# Patient Record
Sex: Female | Born: 1985 | Race: Black or African American | Hispanic: No | Marital: Single | Smoking: Never smoker
Health system: Southern US, Community
[De-identification: ages and names within clinical notes are randomized; demographics above are authoritative.]

## PROBLEM LIST (undated history)

## (undated) DIAGNOSIS — I10 Essential (primary) hypertension: Secondary | ICD-10-CM

---

## 2016-10-14 ENCOUNTER — Emergency Department (HOSPITAL_COMMUNITY)
Admission: EM | Admit: 2016-10-14 | Discharge: 2016-10-14 | Disposition: A | Discharge status: Home / Self Care | Payer: Self-pay | Attending: Emergency Medicine | Admitting: Emergency Medicine

## 2016-10-14 ENCOUNTER — Encounter (HOSPITAL_COMMUNITY): Payer: Self-pay | Admitting: *Deleted

## 2016-10-14 DIAGNOSIS — I10 Essential (primary) hypertension: Secondary | ICD-10-CM | POA: Insufficient documentation

## 2016-10-14 DIAGNOSIS — G43009 Migraine without aura, not intractable, without status migrainosus: Secondary | ICD-10-CM | POA: Insufficient documentation

## 2016-10-14 DIAGNOSIS — Z79899 Other long term (current) drug therapy: Secondary | ICD-10-CM | POA: Insufficient documentation

## 2016-10-14 HISTORY — DX: Essential (primary) hypertension: I10

## 2016-10-14 MED ORDER — SODIUM CHLORIDE 0.9 % IV BOLUS (SEPSIS)
1000.0000 mL | Freq: Once | INTRAVENOUS | Status: AC
  Administered 2016-10-14: 1000 mL via INTRAVENOUS

## 2016-10-14 MED ORDER — METOCLOPRAMIDE HCL 5 MG/ML IJ SOLN
10.0000 mg | Freq: Once | INTRAMUSCULAR | Status: AC
  Administered 2016-10-14: 10 mg via INTRAVENOUS
  Filled 2016-10-14: qty 2

## 2016-10-14 MED ORDER — NAPROXEN 500 MG PO TABS: 500.0000 mg | ORAL_TABLET | Freq: Two times a day (BID) | ORAL | 0 refills | Status: DC

## 2016-10-14 MED ORDER — PROCHLORPERAZINE EDISYLATE 5 MG/ML IJ SOLN
10.0000 mg | Freq: Once | INTRAMUSCULAR | Status: AC
  Administered 2016-10-14: 10 mg via INTRAVENOUS
  Filled 2016-10-14: qty 2

## 2016-10-14 MED ORDER — BUTALBITAL-APAP-CAFFEINE 50-325-40 MG PO TABS: 1.0000 | ORAL_TABLET | Freq: Four times a day (QID) | ORAL | 0 refills | Status: DC | PRN

## 2016-10-14 MED ORDER — KETOROLAC TROMETHAMINE 30 MG/ML IJ SOLN
30.0000 mg | Freq: Once | INTRAMUSCULAR | Status: AC
  Administered 2016-10-14: 30 mg via INTRAVENOUS
  Filled 2016-10-14: qty 1

## 2016-10-14 NOTE — Discharge Instructions (Signed)
Return to the ED with any concerns including changes in vision or speech, weakness of arms or legs, fainting, seizure activity, vomiting and not able to keep down liquids, decreased levlel of alertness/lethargy, or any other alarming symptoms

## 2016-10-14 NOTE — ED Notes (Signed)
Patient was alert, oriented and stable upon discharge. RN went over AVS and patient had no further questions.  

## 2016-10-14 NOTE — ED Triage Notes (Signed)
Pt complains of constant headache for the past month since moving to [place] from [place]. Pt states the pain is now 7/10, but is sometimes 10/10.

## 2016-10-14 NOTE — ED Notes (Signed)
ED Provider at bedside. 

## 2016-10-14 NOTE — ED Provider Notes (Signed)
[facility]-EMER[facility]ENCY DEPT Provider Note   CSN: [id number] Arrival date & time: 10/14/16  1614     History   Chief Complaint Chief Complaint  Patient presents with  . Headache    HPI Joyce Collins is a 30 y.o. female.  HPI  Pt with hx of migraines presents with ongoing headache daily for the past several days. She states she moved from [place] and has been off the topamax that she had been prescribed for her migraines.  HA is throbbing, constant, gradual in onset and feels like her prior migraines.  Associated with nausea. No vomiting.  No fever, no neck pain.  She has tried excedrin today without much relief.  There are no other associated systemic symptoms, there are no other alleviating or modifying factors.   Past Medical History:  Diagnosis Date  . Hypertension     There are no active problems to display for this patient.   History reviewed. No pertinent surgical history.  OB History    No data available       Home Medications    Prior to Admission medications   Medication Sig Start Date End Date Taking? Authorizing Provider  butalbital-acetaminophen-caffeine (FIORICET, ESGIC) [phone] MG tablet Take 1-2 tablets by mouth every 6 (six) hours as needed for headache. 10/14/16 10/14/17  Jerelyn ScottMartha Linker, MD  naproxen (NAPROSYN) 500 MG tablet Take 1 tablet (500 mg total) by mouth 2 (two) times daily. 10/14/16   Jerelyn ScottMartha Linker, MD    Family History No family history on file.  Social History Social History  Substance Use Topics  . Smoking status: Never Smoker  . Smokeless tobacco: Never Used  . Alcohol use No     Allergies   Patient has no allergy information on record.   Review of Systems Review of Systems  ROS reviewed and all otherwise negative except for mentioned in HPI   Physical Exam Updated Vital Signs BP (!) 147/108 (BP Location: Left Arm)   Pulse 98   Temp 97.8 F (36.6 C) (Oral)   Resp 20   SpO2 100%  Vitals reviewed Physical  Exam Physical Examination: General appearance - alert, well appearing, and in no distress Mental status - alert, oriented to person, place, and time Eyes - pupils equal and reactive, extraocular eye movements intact Mouth - mucous membranes moist, pharynx normal without lesions Neck - supple, no significant adenopathy Chest - clear to auscultation, no wheezes, rales or rhonchi, symmetric air entry Heart - normal rate, regular rhythm, normal S1, S2, no murmurs, rubs, clicks or gallops Abdomen - soft, nontender, nondistended, no masses or organomegaly Neurological - alert, oriented x 3, normal speech, cranial nerves 2-12 tested and intact, strength 5/5 in extremities x 4, sensation intact Extremities - peripheral pulses normal, no pedal edema, no clubbing or cyanosis Skin - normal coloration and turgor, no rashes  ED Treatments / Results  Labs (all labs ordered are listed, but only abnormal results are displayed) Labs Reviewed - No data to display  EKG  EKG Interpretation None       Radiology No results found.  Procedures Procedures (including critical care time)  Medications Ordered in ED Medications  ketorolac (TORADOL) 30 MG/ML injection 30 mg (30 mg Intravenous Given 10/14/16 1725)  prochlorperazine (COMPAZINE) injection 10 mg (10 mg Intravenous Given 10/14/16 1726)  metoCLOPramide (REGLAN) injection 10 mg (10 mg Intravenous Given 10/14/16 1725)  sodium chloride 0.9 % bolus 1,000 mL (0 mLs Intravenous Stopped 10/14/16 1851)     Initial Impression /  Assessment and Plan / ED Course  I have reviewed the triage vital signs and the nursing notes.  Pertinent labs & imaging results that were available during my care of the patient were reviewed by me and considered in my medical decision making (see chart for details).  Clinical Course   6:58 PM on recheck patient feels much improved.    Pt presenting with headache c/w her prior migraines.  HA gradual in onset, normal neuro  exam. Doubt SAH, venous sinus thrombosis, meningitis or other causes headache.  Pt feels much improved after migraine cocktail.  Given information for PMD and neurology folowup .  Discharged with strict return precautions.  Pt agreeable with plan.  Final Clinical Impressions(s) / ED Diagnoses   Final diagnoses:  Migraine without aura and without status migrainosus, not intractable    New Prescriptions Discharge Medication List as of 10/14/2016  7:01 PM    START taking these medications   Details  butalbital-acetaminophen-caffeine (FIORICET, ESGIC) 50-325-40 MG tablet Take 1-2 tablets by mouth every 6 (six) hours as needed for headache., Starting Sun 10/14/2016, Until Mon 10/14/2017, Print    naproxen (NAPROSYN) 500 MG tablet Take 1 tablet (500 mg total) by mouth 2 (two) times daily., Starting Sun 10/14/2016, Print         Jerelyn ScottMartha Linker, MD 10/14/16 2030

## 2017-02-10 ENCOUNTER — Emergency Department (HOSPITAL_COMMUNITY): Payer: Self-pay

## 2017-02-10 ENCOUNTER — Emergency Department (HOSPITAL_COMMUNITY)
Admission: EM | Admit: 2017-02-10 | Discharge: 2017-02-11 | Disposition: A | Discharge status: Home / Self Care | Payer: Self-pay | Attending: Emergency Medicine | Admitting: Emergency Medicine

## 2017-02-10 ENCOUNTER — Encounter (HOSPITAL_COMMUNITY): Payer: Self-pay | Admitting: Emergency Medicine

## 2017-02-10 DIAGNOSIS — R000 Tachycardia, unspecified: Secondary | ICD-10-CM

## 2017-02-10 DIAGNOSIS — I10 Essential (primary) hypertension: Secondary | ICD-10-CM | POA: Insufficient documentation

## 2017-02-10 DIAGNOSIS — R0789 Other chest pain: Secondary | ICD-10-CM

## 2017-02-10 LAB — CBC
HCT: 37.7 % (ref 36.0–46.0)
Hemoglobin: 12.9 g/dL (ref 12.0–15.0)
MCH: 28.4 pg (ref 26.0–34.0)
MCHC: 34.2 g/dL (ref 30.0–36.0)
MCV: 83 fL (ref 78.0–100.0)
PLATELETS: 343 10*3/uL (ref 150–400)
RBC: 4.54 MIL/uL (ref 3.87–5.11)
RDW: 12.9 % (ref 11.5–15.5)
WBC: 10.2 10*3/uL (ref 4.0–10.5)

## 2017-02-10 LAB — URINALYSIS, ROUTINE W REFLEX MICROSCOPIC
Bacteria, UA: NONE SEEN
Bilirubin Urine: NEGATIVE
Glucose, UA: NEGATIVE mg/dL
Hgb urine dipstick: NEGATIVE
KETONES UR: NEGATIVE mg/dL
Leukocytes, UA: NEGATIVE
Nitrite: NEGATIVE
PROTEIN: 30 mg/dL — AB
Specific Gravity, Urine: 1.026 (ref 1.005–1.030)
pH: 6 (ref 5.0–8.0)

## 2017-02-10 LAB — I-STAT TROPONIN, ED
TROPONIN I, POC: 0 ng/mL (ref 0.00–0.08)
Troponin i, poc: 0 ng/mL (ref 0.00–0.08)

## 2017-02-10 LAB — BASIC METABOLIC PANEL
Anion gap: 6 (ref 5–15)
BUN: 14 mg/dL (ref 6–20)
CALCIUM: 9.7 mg/dL (ref 8.9–10.3)
CHLORIDE: 103 mmol/L (ref 101–111)
CO2: 27 mmol/L (ref 22–32)
CREATININE: 0.78 mg/dL (ref 0.44–1.00)
GFR calc Af Amer: >60 mL/min (ref >60)
GFR calc non Af Amer: >60 mL/min (ref >60)
Glucose, Bld: 97 mg/dL (ref 65–99)
Potassium: 3.4 mmol/L — ABNORMAL LOW (ref 3.5–5.1)
SODIUM: 136 mmol/L (ref 135–145)

## 2017-02-10 LAB — POC URINE PREG, ED: PREG TEST UR: NEGATIVE

## 2017-02-10 MED ORDER — HYDROCODONE-ACETAMINOPHEN 5-325 MG PO TABS
2.0000 | ORAL_TABLET | Freq: Once | ORAL | Status: AC
Start: 2017-02-10 — End: 2017-02-10
  Administered 2017-02-10: 2 via ORAL
  Filled 2017-02-10 (×2): qty 2

## 2017-02-10 MED ORDER — IOPAMIDOL (ISOVUE-370) INJECTION 76%
INTRAVENOUS | Status: AC
  Administered 2017-02-10: 80 mL via INTRAVENOUS
  Filled 2017-02-10: qty 100

## 2017-02-10 NOTE — ED Provider Notes (Signed)
[facility]PT Provider Note   CSN: [id number] Arrival date & time: 02/10/17  1904   By signing my name below, I, Clarisse Gouge, attest that this documentation has been prepared under the direction and in the presence of [employer], PA-C. Electronically Signed: Clarisse Gouge, Scribe. 02/10/17. 10:37 PM.   History   Chief Complaint Chief Complaint  Patient presents with  . Chest Pain   The history is provided by the patient and medical records. No language interpreter was used.    HPI Comments: Joyce Collins is a 31 y.o. female with Hx of HTN who presents to the Emergency Department complaining of intermittent chest pain x 2 AM today waking her from sleep. She notes Hx of similar > 1 year ago. Pt describes sharp, stabbing, episodic 8/10 pain that is improved with rest, completely subsiding without intervention and worsened with deep breathing. She notes associated palpitations, high heart rates and anxiety. She denies Nausea, vomiting, diaphoresis, shortness of breath, weakness, numbness, syncope, near syncope. She states she recently moved here and she was followed by a cardiologist in [place]. Pt on lisinopril for HTN and depo provera. She adds she used to take medications for anxiety before recently moving to the area. She denies long travel, immobilization, leg swelling, Hx of blood clots, FMHx of clots or sickle cell, fever, chills, cough, congestion or recent surgery.  Past Medical History:  Diagnosis Date  . Hypertension     There are no active problems to display for this patient.   History reviewed. No pertinent surgical history.  OB History    No data available       Home Medications    Prior to Admission medications   Medication Sig Start Date End Date Taking? Authorizing Provider  aspirin-acetaminophen-caffeine (EXCEDRIN MIGRAINE) [phone] MG tablet Take 2 tablets by mouth every 6 (six) hours as needed for headache.   Yes Historical Provider, MD    lisinopril (PRINIVIL,ZESTRIL) 10 MG tablet Take 10 mg by mouth daily.   Yes Historical Provider, MD    Family History History reviewed. No pertinent family history.  Social History Social History  Substance Use Topics  . Smoking status: Never Smoker  . Smokeless tobacco: Never Used  . Alcohol use No     Allergies   Patient has no known allergies.   Review of Systems Review of Systems  Constitutional: Negative for chills and fever.  HENT: Negative for congestion.   Respiratory: Negative for cough and shortness of breath.   Cardiovascular: Positive for chest pain and palpitations. Negative for leg swelling.       -long distance travel, -recent immobilization  All other systems reviewed and are negative.    Physical Exam Updated Vital Signs BP 119/86 (BP Location: Left Arm)   Pulse 94   Temp 97.8 F (36.6 C) (Oral)   Resp 18   Ht  (1.6 m)   Wt 196 lb (88.9 kg)   SpO2 100%   BMI 34.72 kg/m   Physical Exam  Constitutional: She appears well-developed and well-nourished. No distress.  Awake, alert, nontoxic appearance  HENT:  Head: Normocephalic and atraumatic.  Mouth/Throat: Oropharynx is clear and moist. No oropharyngeal exudate.  Eyes: Conjunctivae are normal. No scleral icterus.  Neck: Normal range of motion. Neck supple.  Cardiovascular: Regular rhythm and intact distal pulses.  Tachycardia present.   Pulses:      Radial pulses are 2+ on the right side, and 2+ on the left side.  Dorsalis pedis pulses are 2+ on the right side, and 2+ on the left side.  Pulmonary/Chest: Effort normal and breath sounds normal. No respiratory distress. She has no wheezes.  Equal chest expansion  Abdominal: Soft. Bowel sounds are normal. She exhibits no mass. There is no tenderness. There is no rebound and no guarding.  Musculoskeletal: Normal range of motion. She exhibits no edema.  No calf Tenderness Negative Homans sign  Neurological: She is alert.  Speech is  clear and goal oriented Moves extremities without ataxia  Skin: Skin is warm and dry. She is not diaphoretic.  Psychiatric: She has a normal mood and affect.  Nursing note and vitals reviewed.    ED Treatments / Results  DIAGNOSTIC STUDIES: Oxygen Saturation is 100% on RA, normal by my interpretation.    COORDINATION OF CARE: 10:22 PM Discussed treatment plan with pt at bedside and pt agreed to plan. Will order C/T, pain medications, blood work and IV.  Labs (all labs ordered are listed, but only abnormal results are displayed) Labs Reviewed  BASIC METABOLIC PANEL - Abnormal; Notable for the following:       Result Value   Potassium 3.4 (*)    All other components within normal limits  URINALYSIS, ROUTINE W REFLEX MICROSCOPIC - Abnormal; Notable for the following:    Protein, ur 30 (*)    Squamous Epithelial / LPF 0-5 (*)    All other components within normal limits  CBC  I-STAT TROPOININ, ED  POC URINE PREG, ED  I-STAT TROPOININ, ED    EKG  EKG Interpretation  Date/Time:  Sunday February 10 2017 19:09:11 EDT Ventricular Rate:  124 PR Interval:    QRS Duration: 78 QT Interval:  339 QTC Calculation: 487 R Axis:   81 Text Interpretation:  Sinus tachycardia Borderline repolarization abnormality Borderline prolonged QT interval Baseline wander in lead(s) II III aVF agree. no old comparison Confirmed by Donnald Garre, MD, Lebron Conners [phone]) on 02/11/2017 1:43:03 AM        Radiology Dg Chest 2 View  Result Date: 02/10/2017 CLINICAL DATA:  Chest pain EXAM: CHEST  2 VIEW COMPARISON:  None. FINDINGS: Normal heart size. Normal mediastinal contour. No pneumothorax. No pleural effusion. Lungs appear clear, with no acute consolidative airspace disease and no pulmonary edema. IMPRESSION: No active cardiopulmonary disease. Electronically Signed   By: Delbert Phenix M.D.   On: 02/10/2017 19:54   Ct Angio Chest Pe W Or Wo Contrast  Result Date: 02/10/2017 CLINICAL DATA:  Acute onset of intermittent  generalized chest pain and palpitations. Initial encounter. EXAM: CT ANGIOGRAPHY CHEST WITH CONTRAST TECHNIQUE: Multidetector CT imaging of the chest was performed using the standard protocol during bolus administration of intravenous contrast. Multiplanar CT image reconstructions and MIPs were obtained to evaluate the vascular anatomy. CONTRAST:  80 mL of Isovue 370 IV contrast COMPARISON:  Chest radiograph performed earlier today at 7:43 p.m. FINDINGS: Cardiovascular:  There is no evidence of pulmonary embolus. The heart is normal in size. The thoracic aorta is unremarkable in appearance, aside from minimal calcification at the distal aortic arch. The great vessels are within normal limits. Mediastinum/Nodes: Residual thymic tissue is within normal limits. No mediastinal lymphadenopathy is seen. No pericardial effusion is identified. The visualized portions of the thyroid gland are unremarkable. No axillary lymphadenopathy is appreciated. Lungs/Pleura: Minimal right basilar scarring is noted. No pleural effusion or pneumothorax is seen. No masses are identified. Upper Abdomen: The visualized portions of the liver and spleen are grossly unremarkable. Musculoskeletal: No  acute osseous abnormalities are identified. The visualized musculature is unremarkable in appearance. Review of the MIP images confirms the above findings. IMPRESSION: 1. No evidence of pulmonary embolus. 2. Minimal right basilar scarring noted.  Lungs otherwise clear. Electronically Signed   By: Roanna Raider M.D.   On: 02/10/2017 23:54    Procedures Procedures (including critical care time)  Medications Ordered in ED Medications  HYDROcodone-acetaminophen (NORCO/VICODIN) 5-325 MG per tablet 2 tablet (2 tablets Oral Given 02/10/17 2341)  iopamidol (ISOVUE-370) 76 % injection (80 mLs Intravenous Contrast Given 02/10/17 2301)     Initial Impression / Assessment and Plan / ED Course  I have reviewed the triage vital signs and the nursing  notes.  Pertinent labs & imaging results that were available during my care of the patient were reviewed by me and considered in my medical decision making (see chart for details).  Clinical Course as of Feb 12 124  Mon Feb 11, 2017  0123 Pt reports she is chest pain free.  She reports she is still nervous about being in the ED.  Her tachycardia is improved but persists.  She reports this is normal for her.  She is without SOB.   [HM]    Clinical Course User Index [HM] Dierdre Forth, PA-C    Patient is to be discharged with recommendation to follow up with PCP in regards to today's hospital visit. Chest pain is not likely of cardiac or pulmonary etiology d/t presentation, CT angio chest negative for PE, no tracheal deviation, no JVD or new murmur, RRR, breath sounds equal bilaterally, EKG without evidence of ischemia, negative troponin, and negative CXR. Pt has tachycardia on arrival and though improved, persists.  Pt reports this is normal for her.  Mild hypokalemia replaced in the department.  Pt has been advised to return to the ED if CP becomes exertional, associated with diaphoresis or nausea, radiates to left jaw/arm, worsens or becomes concerning in any way. Pt appears reliable for follow up and is agreeable to discharge.   BP 107/66   Pulse 100   Temp 97.8 F (36.6 C) (Oral)   Resp (!) 22   Ht  (1.6 m)   Wt 88.9 kg   SpO2 98%   BMI 34.72 kg/m   Case has been discussed with and ECG reviewed by Dr. Donnald Garre who agrees with the above plan to discharge.   Final Clinical Impressions(s) / ED Diagnoses   Final diagnoses:  Other chest pain  Tachycardia    New Prescriptions Current Discharge Medication List     I personally performed the services described in this documentation, which was scribed in my presence. The recorded information has been reviewed and is accurate.    Dahlia Client Shandi Godfrey, PA-C 02/11/17 0147    Arby Barrette, MD 02/12/17 1640

## 2017-02-10 NOTE — ED Triage Notes (Signed)
Pt c/o intermittent sharp central chest pain usually with movement or inspiration but sometimes at rest too, onset this morning at about 0200. No SOB, dizziness, arm or jaw pain/numbness.

## 2017-02-11 NOTE — Discharge Instructions (Signed)
1. Medications: usual home medications 2. Treatment: rest, drink plenty of fluids,  3. Follow Up: Please followup with Cardiology in 2-3 days for discussion of your diagnoses and further evaluation after today's visit; if you do not have a primary care doctor use the resource guide provided to find one; Please return to the ER for chest pain that radiates, worsening palpitations, passing out or other concerns

## 2017-04-17 ENCOUNTER — Emergency Department (HOSPITAL_COMMUNITY): Payer: Self-pay

## 2017-04-17 ENCOUNTER — Emergency Department (HOSPITAL_COMMUNITY)
Admission: EM | Admit: 2017-04-17 | Discharge: 2017-04-17 | Disposition: A | Discharge status: Home / Self Care | Payer: Self-pay | Attending: Emergency Medicine | Admitting: Emergency Medicine

## 2017-04-17 ENCOUNTER — Encounter (HOSPITAL_COMMUNITY): Payer: Self-pay | Admitting: Emergency Medicine

## 2017-04-17 DIAGNOSIS — Z7982 Long term (current) use of aspirin: Secondary | ICD-10-CM | POA: Insufficient documentation

## 2017-04-17 DIAGNOSIS — I10 Essential (primary) hypertension: Secondary | ICD-10-CM | POA: Insufficient documentation

## 2017-04-17 DIAGNOSIS — Z79899 Other long term (current) drug therapy: Secondary | ICD-10-CM | POA: Insufficient documentation

## 2017-04-17 DIAGNOSIS — R0789 Other chest pain: Secondary | ICD-10-CM | POA: Insufficient documentation

## 2017-04-17 DIAGNOSIS — Z793 Long term (current) use of hormonal contraceptives: Secondary | ICD-10-CM | POA: Insufficient documentation

## 2017-04-17 LAB — CBC WITH DIFFERENTIAL/PLATELET
BASOS ABS: 0 10*3/uL (ref 0.0–0.1)
Basophils Relative: 0 %
Eosinophils Absolute: 0.1 10*3/uL (ref 0.0–0.7)
Eosinophils Relative: 1 %
HEMATOCRIT: 35.6 % — AB (ref 36.0–46.0)
Hemoglobin: 12.1 g/dL (ref 12.0–15.0)
LYMPHS ABS: 1.9 10*3/uL (ref 0.7–4.0)
LYMPHS PCT: 26 %
MCH: 28.7 pg (ref 26.0–34.0)
MCHC: 34 g/dL (ref 30.0–36.0)
MCV: 84.6 fL (ref 78.0–100.0)
MONO ABS: 0.5 10*3/uL (ref 0.1–1.0)
Monocytes Relative: 7 %
NEUTROS ABS: 4.9 10*3/uL (ref 1.7–7.7)
Neutrophils Relative %: 66 %
Platelets: 280 10*3/uL (ref 150–400)
RBC: 4.21 MIL/uL (ref 3.87–5.11)
RDW: 12.6 % (ref 11.5–15.5)
WBC: 7.4 10*3/uL (ref 4.0–10.5)

## 2017-04-17 LAB — I-STAT TROPONIN, ED: TROPONIN I, POC: 0.01 ng/mL (ref 0.00–0.08)

## 2017-04-17 LAB — COMPREHENSIVE METABOLIC PANEL
ALT: 24 U/L (ref 14–54)
AST: 27 U/L (ref 15–41)
Albumin: 4 g/dL (ref 3.5–5.0)
Alkaline Phosphatase: 77 U/L (ref 38–126)
Anion gap: 8 (ref 5–15)
BILIRUBIN TOTAL: 0.7 mg/dL (ref 0.3–1.2)
BUN: 11 mg/dL (ref 6–20)
CO2: 21 mmol/L — ABNORMAL LOW (ref 22–32)
CREATININE: 0.59 mg/dL (ref 0.44–1.00)
Calcium: 9.5 mg/dL (ref 8.9–10.3)
Chloride: 108 mmol/L (ref 101–111)
GFR calc Af Amer: >60 mL/min (ref >60)
Glucose, Bld: 123 mg/dL — ABNORMAL HIGH (ref 65–99)
Potassium: 3.5 mmol/L (ref 3.5–5.1)
Sodium: 137 mmol/L (ref 135–145)
TOTAL PROTEIN: 8.1 g/dL (ref 6.5–8.1)

## 2017-04-17 LAB — I-STAT BETA HCG BLOOD, ED (MC, WL, AP ONLY): I-stat hCG, quantitative: <5 m[IU]/mL (ref <5)

## 2017-04-17 MED ORDER — CYCLOBENZAPRINE HCL 5 MG PO TABS
5.0000 mg | ORAL_TABLET | Freq: Three times a day (TID) | ORAL | 0 refills | Status: AC | PRN
Start: 2017-04-17 — End: ?

## 2017-04-17 MED ORDER — IBUPROFEN 600 MG PO TABS: 600.0000 mg | ORAL_TABLET | Freq: Four times a day (QID) | ORAL | 0 refills | Status: AC | PRN

## 2017-04-17 MED ORDER — CYCLOBENZAPRINE HCL 10 MG PO TABS
5.0000 mg | ORAL_TABLET | Freq: Once | ORAL | Status: AC
  Administered 2017-04-17: 5 mg via ORAL
  Filled 2017-04-17: qty 1

## 2017-04-17 NOTE — Discharge Instructions (Signed)
Take motrin for pain.   Take flexeril for muscle strain.   Rest for several days.   See your doctor  Return to ER if you have worse chest pain, trouble breathing.

## 2017-04-17 NOTE — ED Provider Notes (Signed)
[facility]PT Provider Note   CSN: [id number] Arrival date & time: 04/17/17  0948     History   Chief Complaint Chief Complaint  Patient presents with  . Chest Pain    HPI  Joyce Collins is a 31 y.o. female hx of HTN, here with chest pain. Patient states that she just started a new job 3 weeks ago and has been under a lot of stress. She has been picking up heavy items. She has been having substernal chest pain that is intermittent and worse with movement. Denies any pleuritic chest pain. No meds prior to arrival. Denies any pain or recent travel or history of blood clots. Patient was seen here 2 months ago for similar symptoms and had a CT angiogram that showed no PE. Didn't follow up with a doctor since she has no insurance currently.   The history is provided by the patient.    Past Medical History:  Diagnosis Date  . Hypertension     There are no active problems to display for this patient.   History reviewed. No pertinent surgical history.  OB History    No data available       Home Medications    Prior to Admission medications   Medication Sig Start Date End Date Taking? Authorizing Provider  aspirin-acetaminophen-caffeine (EXCEDRIN MIGRAINE) [phone] MG tablet Take 1-2 tablets by mouth every 6 (six) hours as needed for headache or migraine.    Yes [provider]  lisinopril (PRINIVIL,ZESTRIL) 10 MG tablet Take 10 mg by mouth daily.   Yes [provider]  medroxyPROGESTERone (DEPO-PROVERA) 150 MG/ML injection Inject 150 mg into the muscle every 3 (three) months.   Yes [provider]    Family History History reviewed. No pertinent family history.  Social History Social History  Substance Use Topics  . Smoking status: Never Smoker  . Smokeless tobacco: Never Used  . Alcohol use No     Allergies   Patient has no known allergies.   Review of Systems Review of Systems  Cardiovascular: Positive for chest pain.  All  other systems reviewed and are negative.    Physical Exam Updated Vital Signs BP (!) 150/98 (BP Location: Left Arm)   Temp 98.2 F (36.8 C) (Oral)   Resp 16   Wt 86.6 kg (191 lb)   SpO2 98%   BMI 33.83 kg/m   Physical Exam  Constitutional: She is oriented to person, place, and time. She appears well-developed and well-nourished.  Slightly anxious   HENT:  Head: Normocephalic.  Mouth/Throat: Oropharynx is clear and moist.  Eyes: EOM are normal. Pupils are equal, round, and reactive to light.  Neck: Normal range of motion. Neck supple.  Cardiovascular: Normal rate and regular rhythm.   Pulmonary/Chest: Effort normal and breath sounds normal. No respiratory distress. She has no wheezes.  + reproducible tenderness, no deformity. Lungs clear   Abdominal: Soft. Bowel sounds are normal. She exhibits no distension. There is no tenderness.  Musculoskeletal: Normal range of motion. She exhibits no edema.  Neurological: She is alert and oriented to person, place, and time.  Skin: Skin is warm.  Psychiatric: She has a normal mood and affect.  Nursing note and vitals reviewed.    ED Treatments / Results  Labs (all labs ordered are listed, but only abnormal results are displayed) Labs Reviewed  CBC WITH DIFFERENTIAL/PLATELET - Abnormal; Notable for the following:       Result Value   HCT 35.6 (*)  All other components within normal limits  COMPREHENSIVE METABOLIC PANEL - Abnormal; Notable for the following:    CO2 21 (*)    Glucose, Bld 123 (*)    All other components within normal limits  I-STAT TROPOININ, ED  I-STAT BETA HCG BLOOD, ED ([facility], [facility], [facility] ONLY)    EKG  EKG Interpretation  Date/Time:  Wednesday April 17 2017 10:04:08 EDT Ventricular Rate:  92 PR Interval:    QRS Duration: 94 QT Interval:  368 QTC Calculation: 456 R Axis:   78 Text Interpretation:  Age not entered, assumed to be  31 years old for purpose of ECG interpretation Sinus rhythm No significant change  since last tracing Confirmed by Richardean CanalYao, Amory Simonetti H [phone]([id number]) on 04/17/2017 10:08:59 AM Also confirmed by Richardean CanalYao, Priya Matsen H [phone]), editor Madalyn RobEverhart, Marilyn [phone])  on 04/17/2017 10:43:30 AM       Radiology Dg Chest 2 View  Result Date: 04/17/2017 CLINICAL DATA:  Chest pain for 2 days. EXAM: CHEST  2 VIEW COMPARISON:  Chest x-ray 02/10/2017 and chest CT 02/10/2017 FINDINGS: The cardiac silhouette, mediastinal and hilar contours are within normal limits and stable. The lungs are clear. No pleural effusion. The bony thorax is intact. IMPRESSION: No acute cardiopulmonary findings. Electronically Signed   By: Rudie MeyerP.  Gallerani M.D.   On: 04/17/2017 10:42    Procedures Procedures (including critical care time)  Medications Ordered in ED Medications  cyclobenzaprine (FLEXERIL) tablet 5 mg (5 mg Oral Given 04/17/17 1016)     Initial Impression / Assessment and Plan / ED Course  I have reviewed the triage vital signs and the nursing notes.  Pertinent labs & imaging results that were available during my care of the patient were reviewed by me and considered in my medical decision making (see chart for details).    Joyce Collins is a 31 y.o. female here with chest pain. Pan is reproducible, low suspicion for ACS. CT angio neg 2 months ago and patient not tachycardic or hypoxic and I doubt PE. Will get labs, CXR. Will give flexeril.   12:04 PM Labs unremarkable. CXR clear. Felt better with flexeril. Likely muscle strain. Will dc home with motrin, flexeril.   Final Clinical Impressions(s) / ED Diagnoses   Final diagnoses:  None    New Prescriptions New Prescriptions   No medications on file     Charlynne PanderYao, Lovetta Condie Hsienta, MD 04/17/17 1204

## 2017-04-17 NOTE — ED Triage Notes (Signed)
Pt c/o chest tightness and sharp intermittent chest pain x 2 days. Pt states mild nausea at times. No distress.

## 2017-08-01 ENCOUNTER — Encounter (HOSPITAL_COMMUNITY): Payer: Self-pay | Admitting: Emergency Medicine

## 2017-08-01 ENCOUNTER — Emergency Department (HOSPITAL_COMMUNITY)
Admission: EM | Admit: 2017-08-01 | Discharge: 2017-08-01 | Disposition: A | Discharge status: Home / Self Care | Payer: Self-pay | Attending: Emergency Medicine | Admitting: Emergency Medicine

## 2017-08-01 ENCOUNTER — Emergency Department (HOSPITAL_COMMUNITY): Payer: Self-pay

## 2017-08-01 DIAGNOSIS — G43009 Migraine without aura, not intractable, without status migrainosus: Secondary | ICD-10-CM | POA: Insufficient documentation

## 2017-08-01 DIAGNOSIS — Z79899 Other long term (current) drug therapy: Secondary | ICD-10-CM | POA: Insufficient documentation

## 2017-08-01 DIAGNOSIS — R0789 Other chest pain: Secondary | ICD-10-CM | POA: Insufficient documentation

## 2017-08-01 LAB — CBC
HCT: 36.4 % (ref 36.0–46.0)
HEMOGLOBIN: 12.4 g/dL (ref 12.0–15.0)
MCH: 30.2 pg (ref 26.0–34.0)
MCHC: 34.1 g/dL (ref 30.0–36.0)
MCV: 88.6 fL (ref 78.0–100.0)
PLATELETS: 290 10*3/uL (ref 150–400)
RBC: 4.11 MIL/uL (ref 3.87–5.11)
RDW: 12.9 % (ref 11.5–15.5)
WBC: 10.5 10*3/uL (ref 4.0–10.5)

## 2017-08-01 LAB — I-STAT BETA HCG BLOOD, ED (NOT ORDERABLE)

## 2017-08-01 LAB — BASIC METABOLIC PANEL
Anion gap: 9 (ref 5–15)
BUN: 16 mg/dL (ref 6–20)
CALCIUM: 9.6 mg/dL (ref 8.9–10.3)
CO2: 24 mmol/L (ref 22–32)
CREATININE: 0.73 mg/dL (ref 0.44–1.00)
Chloride: 108 mmol/L (ref 101–111)
Glucose, Bld: 84 mg/dL (ref 65–99)
Potassium: 3.3 mmol/L — ABNORMAL LOW (ref 3.5–5.1)
SODIUM: 141 mmol/L (ref 135–145)

## 2017-08-01 LAB — POCT I-STAT TROPONIN I: Troponin i, poc: 0 ng/mL (ref 0.00–0.08)

## 2017-08-01 MED ORDER — SODIUM CHLORIDE 0.9 % IV BOLUS (SEPSIS)
1000.0000 mL | Freq: Once | INTRAVENOUS | Status: AC
  Administered 2017-08-01: 1000 mL via INTRAVENOUS

## 2017-08-01 MED ORDER — DIPHENHYDRAMINE HCL 50 MG/ML IJ SOLN
25.0000 mg | Freq: Once | INTRAMUSCULAR | Status: AC
  Administered 2017-08-01: 25 mg via INTRAVENOUS
  Filled 2017-08-01: qty 1

## 2017-08-01 MED ORDER — PROCHLORPERAZINE EDISYLATE 5 MG/ML IJ SOLN
10.0000 mg | Freq: Once | INTRAMUSCULAR | Status: AC
  Administered 2017-08-01: 10 mg via INTRAVENOUS
  Filled 2017-08-01: qty 2

## 2017-08-01 MED ORDER — KETOROLAC TROMETHAMINE 30 MG/ML IJ SOLN
30.0000 mg | Freq: Once | INTRAMUSCULAR | Status: AC
  Administered 2017-08-01: 30 mg via INTRAVENOUS
  Filled 2017-08-01: qty 1

## 2017-08-01 NOTE — ED Provider Notes (Signed)
[facility]-EMERGENCY DEPT Provider Note   CSN: [id number] Arrival date & time: 08/01/17  1400     History   Chief Complaint Chief Complaint  Patient presents with  . Headache  . Chest Pain    HPI Joyce Collins is a 31 y.o. female.  HPI Patient presents to the emergency department with migraine headache along with chest discomfort.  These symptoms have been ongoing over the last 3 days.  The patient states the chest discomfort is mainly when she is at work.  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  Patient did not take any medications prior to arrival for her symptoms.  Patient states this feels like a classic migraine for herThe patient denies shortness of breath,blurred vision, neck pain, fever, cough, weakness, numbness, dizziness, anorexia, edema, abdominal pain, nausea, vomiting, diarrhea, rash, back pain, dysuria, hematemesis, bloody stool, near syncope, or syncope. Past Medical History:  Diagnosis Date  . Hypertension     There are no active problems to display for this patient.   History reviewed. No pertinent surgical history.  OB History    No data available       Home Medications    Prior to Admission medications   Medication Sig Start Date End Date Taking? Authorizing Provider  aspirin-acetaminophen-caffeine (EXCEDRIN MIGRAINE) [phone] MG tablet Take 1-2 tablets by mouth every 6 (six) hours as needed for headache or migraine.    Yes [provider]  lisinopril (PRINIVIL,ZESTRIL) 10 MG tablet Take 10 mg by mouth daily.   Yes [provider]  cyclobenzaprine (FLEXERIL) 5 MG tablet Take 1 tablet (5 mg total) by mouth 3 (three) times daily as needed for muscle spasms. Patient not taking: Reported on 08/01/2017 04/17/17   Charlynne Pander, MD  ibuprofen (ADVIL,MOTRIN) 600 MG tablet Take 1 tablet (600 mg total) by mouth every 6 (six) hours as needed. Patient not taking: Reported on  08/01/2017 04/17/17   Charlynne Pander, MD  medroxyPROGESTERone (DEPO-PROVERA) 150 MG/ML injection Inject 150 mg into the muscle every 3 (three) months.    [provider]    Family History No family history on file.  Social History Social History  Substance Use Topics  . Smoking status: Never Smoker  . Smokeless tobacco: Never Used  . Alcohol use No     Allergies   Patient has no known allergies.   Review of Systems Review of Systems  All other systems negative except as documented in the HPI. All pertinent positives and negatives as reviewed in the HPI. Physical Exam Updated Vital Signs BP (!) 153/116 (BP Location: Left Arm)   Pulse (!) 112   Temp 98.5 F (36.9 C) (Oral)   Resp 17   SpO2 100%   Physical Exam  Constitutional: She is oriented to person, place, and time. She appears well-developed and well-nourished. No distress.  HENT:  Head: Normocephalic and atraumatic.  Mouth/Throat: Oropharynx is clear and moist.  Eyes: Pupils are equal, round, and reactive to light.  Neck: Normal range of motion. Neck supple.  Cardiovascular: Normal rate, regular rhythm and normal heart sounds.  Exam reveals no gallop and no friction rub.   No murmur heard. Pulmonary/Chest: Effort normal and breath sounds normal. No respiratory distress. She has no wheezes.  Abdominal: Soft. Bowel sounds are normal. She exhibits no distension. There is no tenderness.  Neurological: She is alert and oriented to person, place, and time. No sensory deficit. She exhibits normal  muscle tone. Coordination normal.  Skin: Skin is warm and dry. Capillary refill takes less than 2 seconds. No rash noted. No erythema.  Psychiatric: She has a normal mood and affect. Her behavior is normal.  Nursing note and vitals reviewed.    ED Treatments / Results  Labs (all labs ordered are listed, but only abnormal results are displayed) Labs Reviewed  BASIC METABOLIC PANEL - Abnormal; Notable for the  following:       Result Value   Potassium 3.3 (*)    All other components within normal limits  CBC  I-STAT TROPONIN, ED  I-STAT BETA HCG BLOOD, ED ([facility], [facility], [facility] ONLY)  I-STAT BETA HCG BLOOD, ED (NOT ORDERABLE)  POCT I-STAT TROPONIN I    EKG  EKG Interpretation  Date/Time:  Thursday August 01 2017 14:55:42 EDT Ventricular Rate:  96 PR Interval:    QRS Duration: 90 QT Interval:  357 QTC Calculation: 452 R Axis:   82 Text Interpretation:  Sinus rhythm Borderline T abnormalities, anterior leads Confirmed by Donnetta Hutching ([id number]) on 08/01/2017 7:26:11 PM       Radiology Dg Chest 2 View  Result Date: 08/01/2017 CLINICAL DATA:  Chest tightness and shortness of breath EXAM: CHEST  2 VIEW COMPARISON:  April 17, 2017 FINDINGS: Lungs are clear. Heart size and pulmonary vascularity are normal. No adenopathy. There is mild lower thoracic levoscoliosis. No pneumothorax. IMPRESSION: No edema or consolidation. Electronically Signed   By: Bretta Bang III M.D.   On: 08/01/2017 15:24    Procedures Procedures (including critical care time)  Medications Ordered in ED Medications  sodium chloride 0.9 % bolus 1,000 mL (1,000 mLs Intravenous New Bag/Given 08/01/17 2012)  prochlorperazine (COMPAZINE) injection 10 mg (10 mg Intravenous Given 08/01/17 2030)  diphenhydrAMINE (BENADRYL) injection 25 mg (25 mg Intravenous Given 08/01/17 2031)  ketorolac (TORADOL) 30 MG/ML injection 30 mg (30 mg Intravenous Given 08/01/17 2031)     Initial Impression / Assessment and Plan / ED Course  I have reviewed the triage vital signs and the nursing notes.  Pertinent labs & imaging results that were available during my care of the patient were reviewed by me and considered in my medical decision making (see chart for details).     Patient's chest pain is atypical for cardiac chest pain.  The patient will be treated for her migraine headache feel that the chest pain is related to more chest wall discomfort  as it happens mostly when she is working and doing repetitive motions pain is sharp in nature  Final Clinical Impressions(s) / ED Diagnoses   Final diagnoses:  None    New Prescriptions New Prescriptions   No medications on file     Charlestine Night, Cordelia Poche 08/01/17 2255    Donnetta Hutching, MD 08/06/17 [phone]

## 2017-08-01 NOTE — Discharge Instructions (Signed)
Return here as needed.  Follow-up with a primary care doctor.  Your testing here tonight did not show any significant abnormality

## 2017-08-01 NOTE — ED Triage Notes (Addendum)
Patient c/o headache, light sensitivity, and fatigue x3 days. Patient also reports sharp intermittent left chest pain x3 days. Denies SOB, abdominal pain, and N/V/D. Reports hx migraines.

## 2017-08-01 NOTE — ED Notes (Signed)
BLOOD DRAW ATTEMPT UNSUCCESSFUL. RN NOTIFIED. 

## 2017-08-01 NOTE — ED Notes (Signed)
Patient requested that she does not want to be cardiac monitored at this time.

## 2017-11-22 IMAGING — CR DG CHEST 2V
2 series · 2 of 2 positions shown · non-contrast
Comparison: April 17, 2017

CLINICAL DATA: Chest tightness and shortness of breath

EXAM:
CHEST  2 VIEW

[w chest pa]
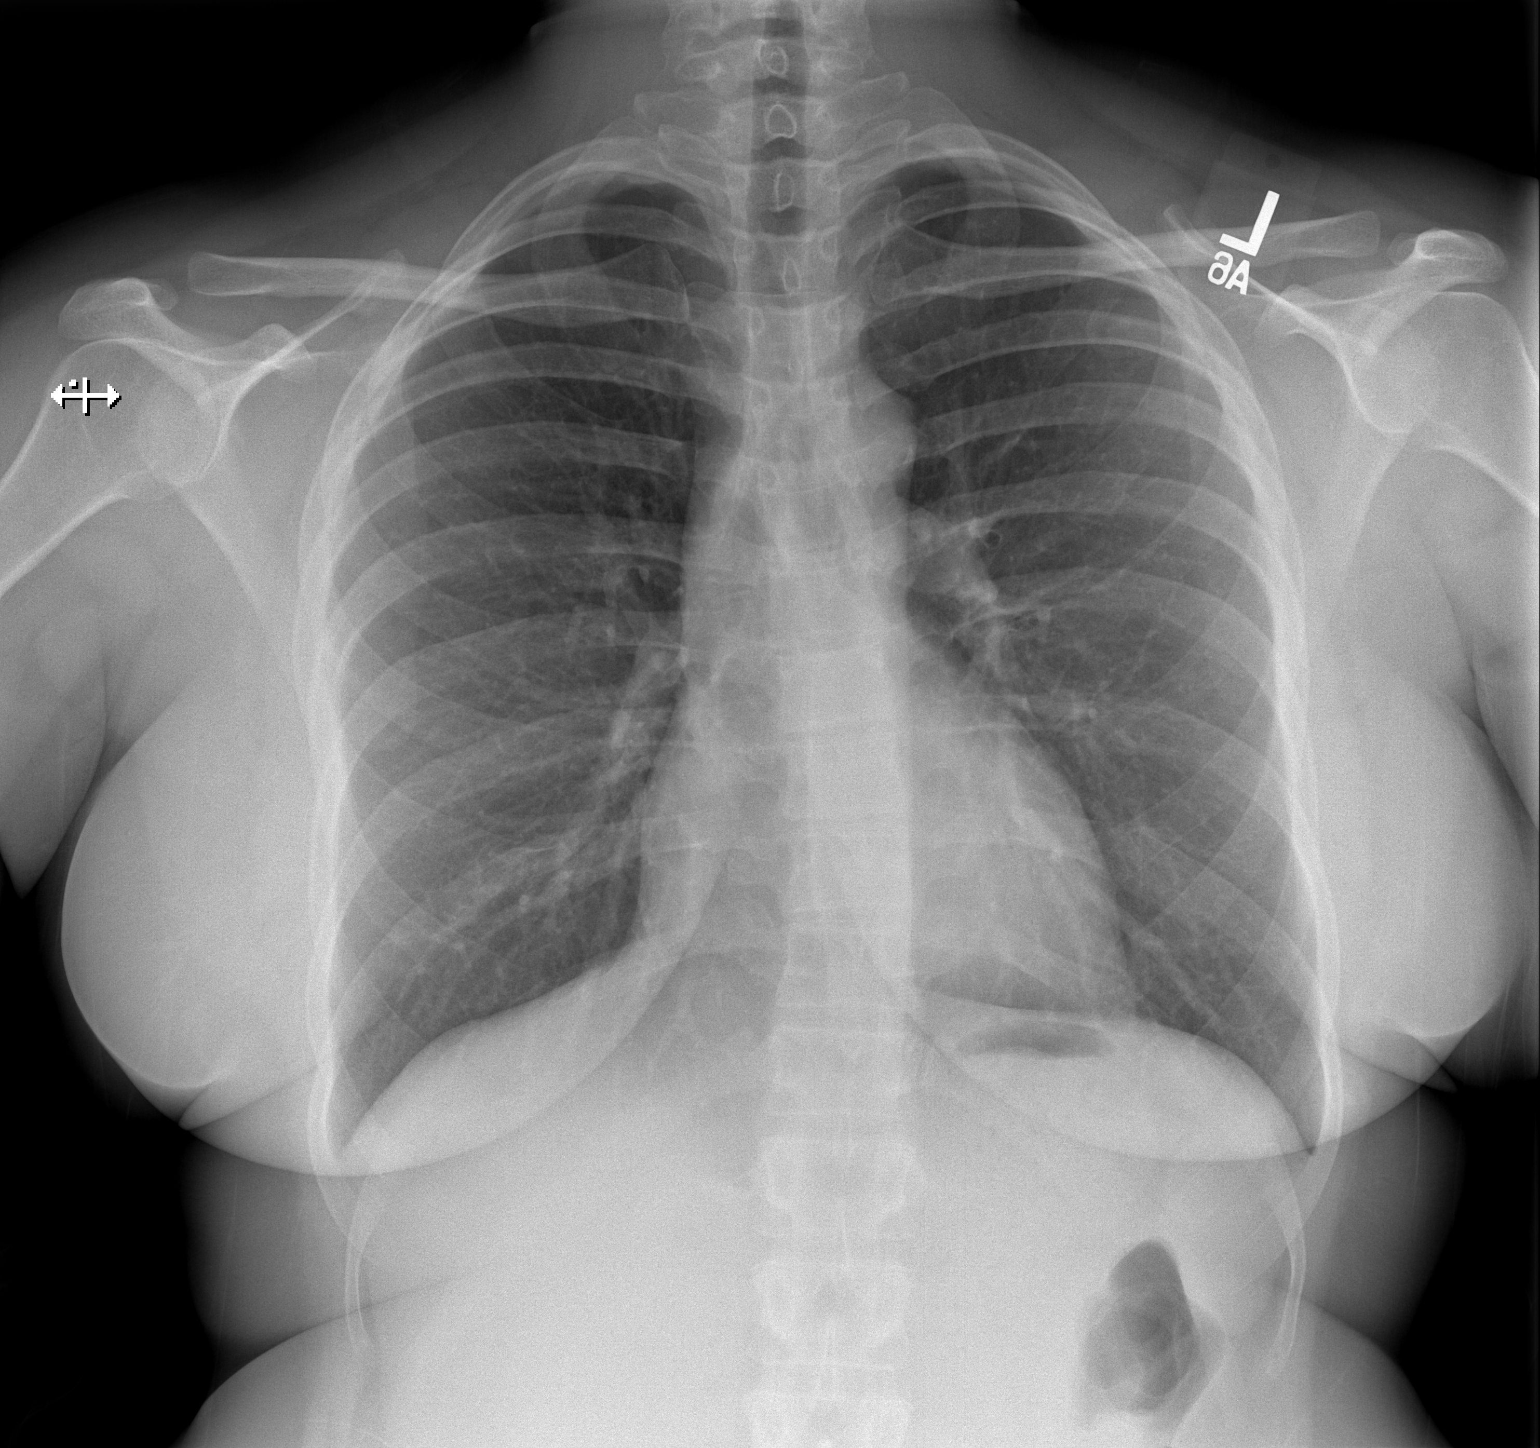

[w chest lat]
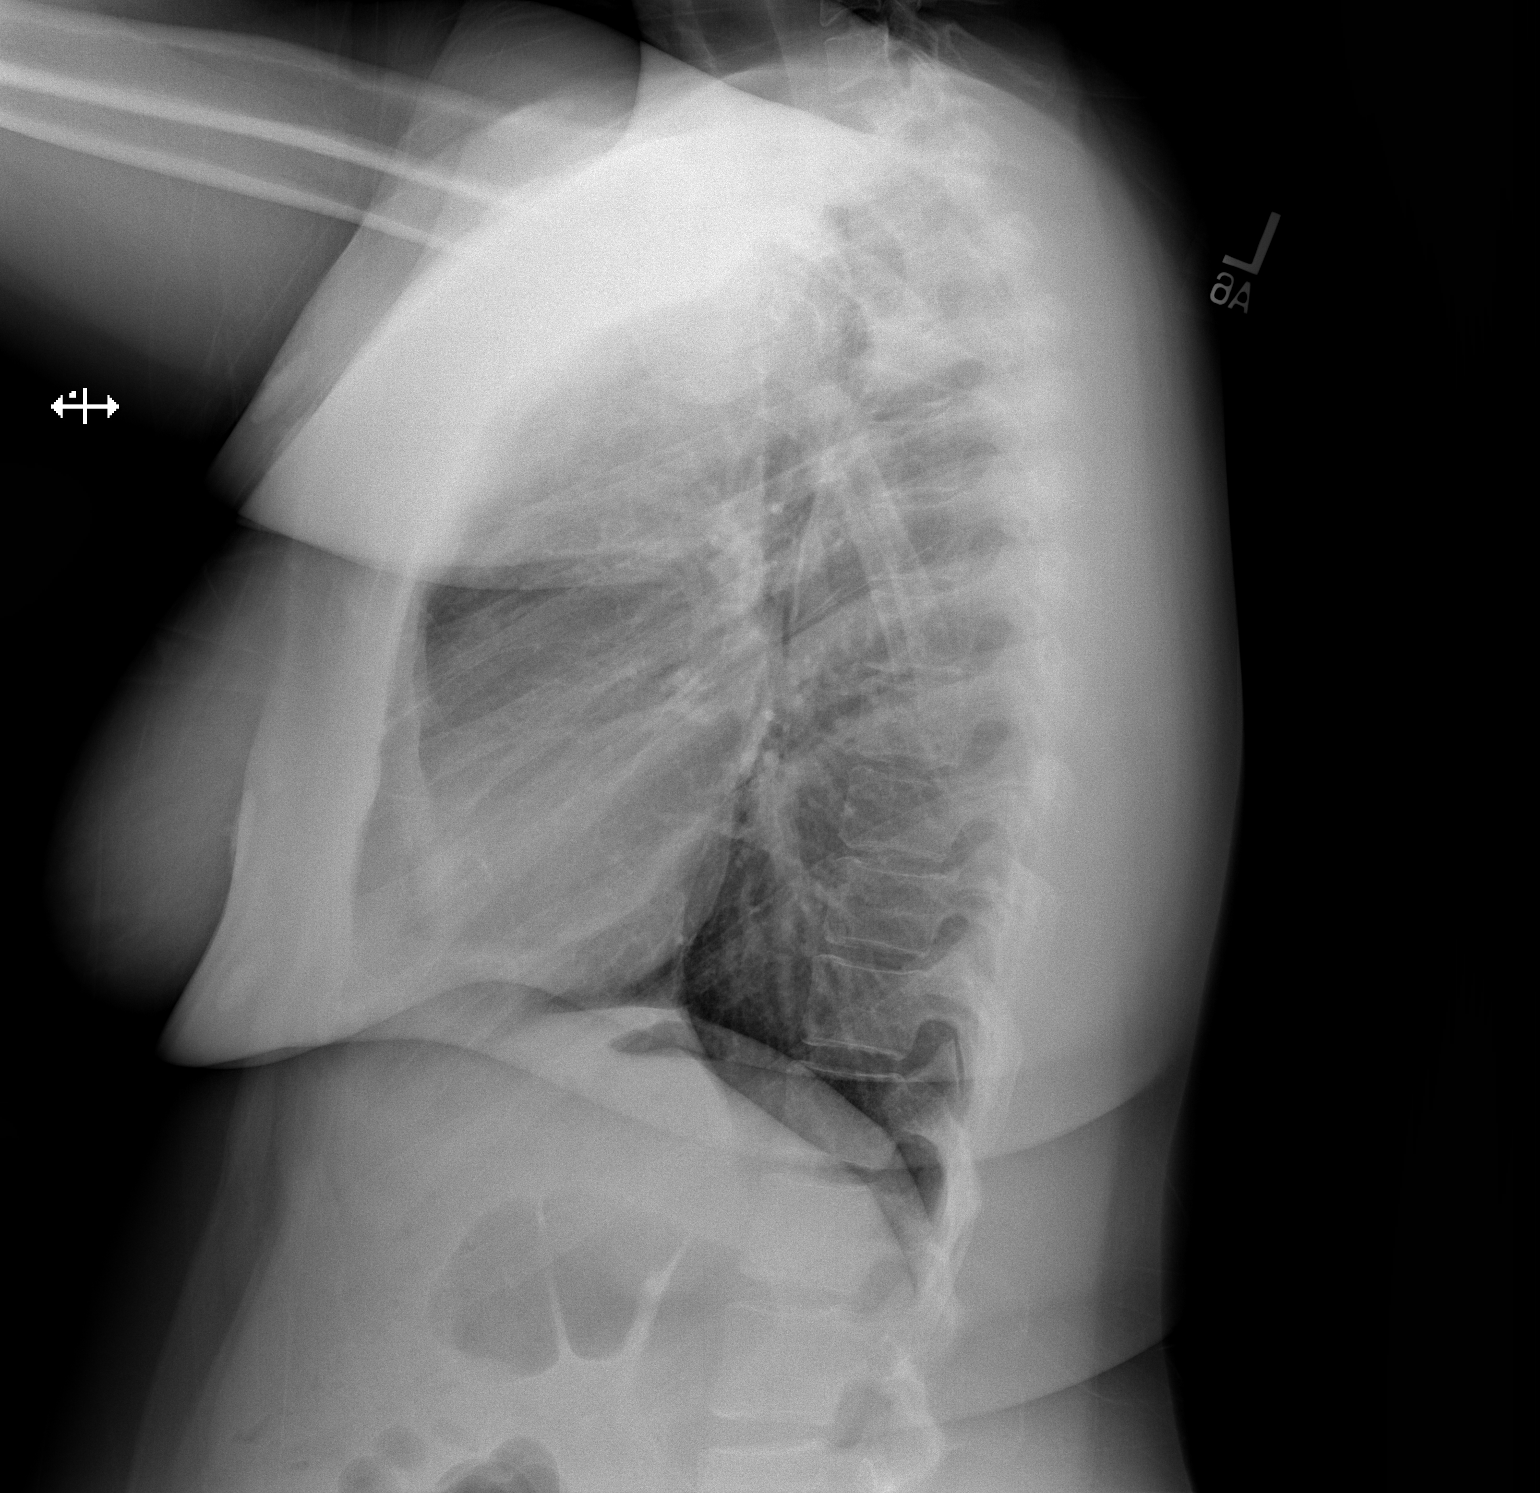

[2 of 2 positions shown; findings below may reference images not displayed]

FINDINGS: Lungs are clear. Heart size and pulmonary vascularity are normal. No
adenopathy. There is mild lower thoracic levoscoliosis. No
pneumothorax.
IMPRESSION: No edema or consolidation.
